# Patient Record
Sex: Female | Born: 1970 | Hispanic: No | Marital: Married | Smoking: Current every day smoker
Health system: Southern US, Academic
[De-identification: ages and names within clinical notes are randomized; demographics above are authoritative.]

## PROBLEM LIST (undated history)

## (undated) DIAGNOSIS — M129 Arthropathy, unspecified: Secondary | ICD-10-CM

## (undated) DIAGNOSIS — K760 Fatty (change of) liver, not elsewhere classified: Secondary | ICD-10-CM

## (undated) HISTORY — PX: HX HYSTERECTOMY: SHX81

## (undated) HISTORY — DX: Arthropathy, unspecified: M12.9

## (undated) HISTORY — PX: HX CARPAL TUNNEL RELEASE: SHX101

## (undated) HISTORY — DX: Fatty (change of) liver, not elsewhere classified: K76.0

---

## 2016-03-20 ENCOUNTER — Ambulatory Visit (HOSPITAL_COMMUNITY): Payer: Self-pay

## 2019-05-19 IMAGING — MR MRI ABDOMEN WITHOUT CONTRAST
10 series · 48 of 48 positions shown · IV contrast (gadolinium)
Comparison: None available.

EXAM:  MRI ABDOMEN WITHOUT CONTRAST
INDICATION: Abdominal pain and swelling.
TECHNIQUE: Multiplanar multisequential MRI of the abdomen was performed without gadolinium contrast.

[Series 12: s-map axial bh · axial · 14.1mm · 7.03mm/px · z∈[-275,+275]mm · 9 of 80 slices shown]
[im 1/80]
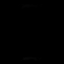
[im 10/80]
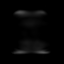
[im 20/80]
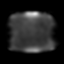
[im 30/80]
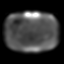
[im 40/80]
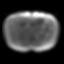
[im 50/80]
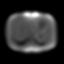
[im 60/80]
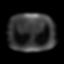
[im 70/80]
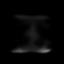
[im 80/80]
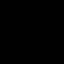

[Series 13: cor basg bh · coronal · 8.0mm · 1.25mm/px · 3 of 28 slices shown]
[im 1/28]
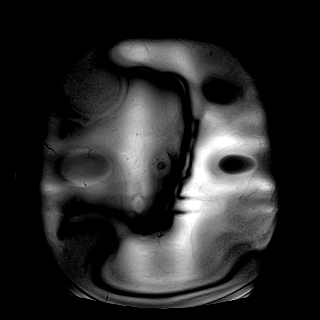
[im 14/28]
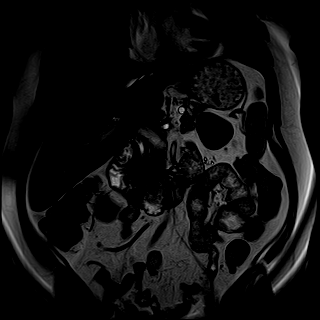
[im 28/28]
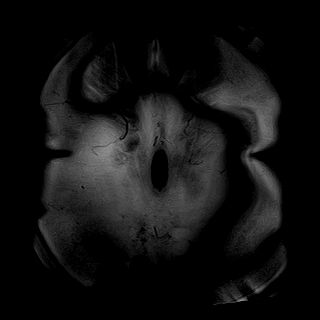

[Series 14: T2 · coronal · 8.0mm · 1.39mm/px · 3 of 28 slices shown (1 of 2)]
[im 1/28]
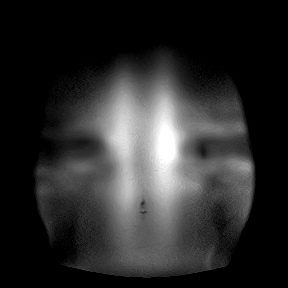
[im 14/28]
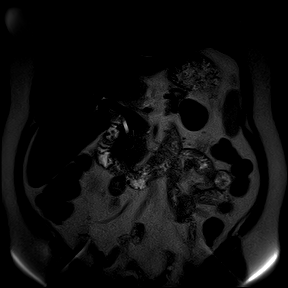
[im 28/28]
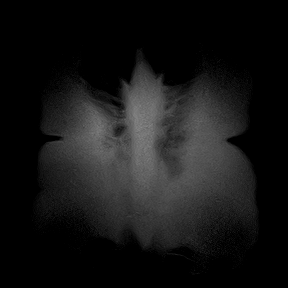

[Series 15: axial in/out phase · axial · 8.0mm · 1.56mm/px · z∈[-112,+185]mm · 4 of 34 slices shown (1 of 2)]
[im 1/34]
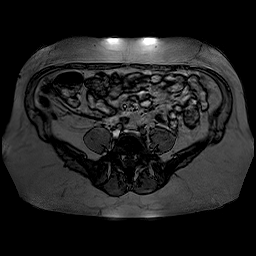
[im 12/34]
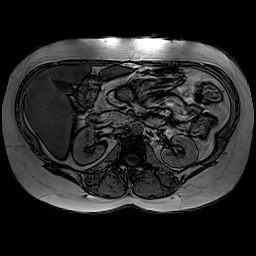
[im 23/34]
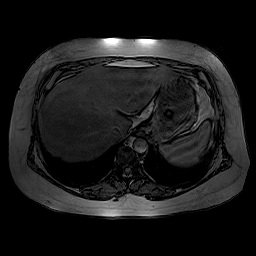
[im 34/34]
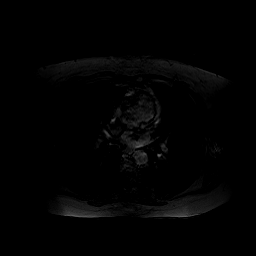

[Series 16: axial in/out phase · axial · 8.0mm · 1.56mm/px · z∈[-112,+185]mm · 4 of 34 slices shown (2 of 2)]
[im 1/34]
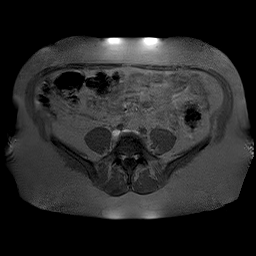
[im 12/34]
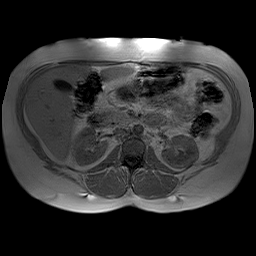
[im 23/34]
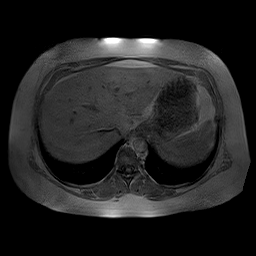
[im 34/34]
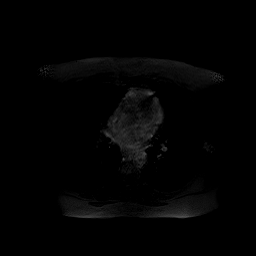

[Series 17: T2 fat-sat · axial · 8.0mm · 1.79mm/px · z∈[-112,+185]mm · 4 of 34 slices shown]
[im 1/34]
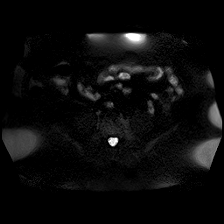
[im 12/34]
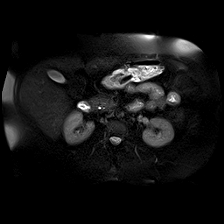
[im 23/34]
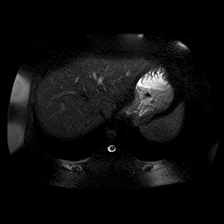
[im 34/34]
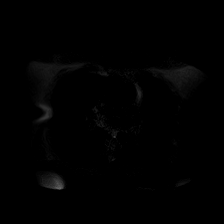

[Series 18: T2 · axial · 8.0mm · 1.56mm/px · z∈[-112,+185]mm · 4 of 34 slices shown (2 of 2)]
[im 1/34]
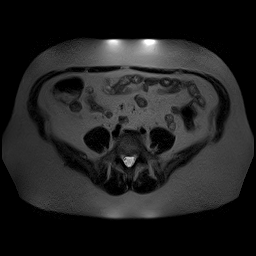
[im 12/34]
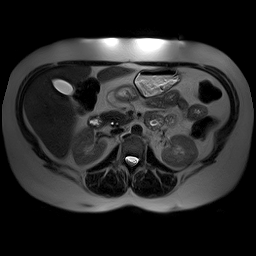
[im 23/34]
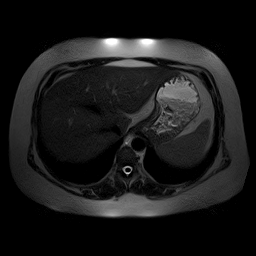
[im 34/34]
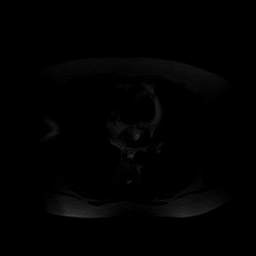

[Series 21: axial basg bh · axial · 8.0mm · 1.25mm/px · z∈[-112,+185]mm · 4 of 34 slices shown]
[im 1/34]
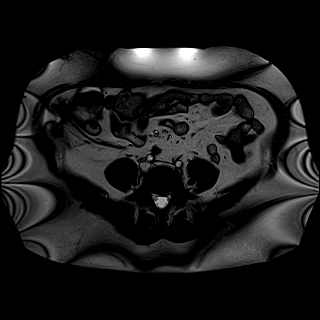
[im 12/34]
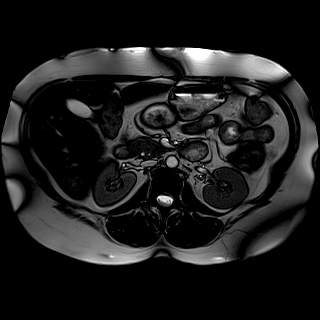
[im 23/34]
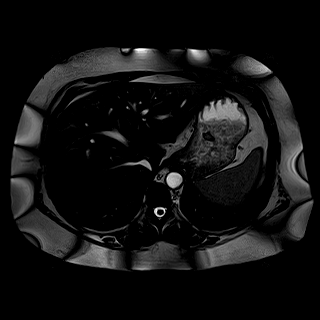
[im 34/34]
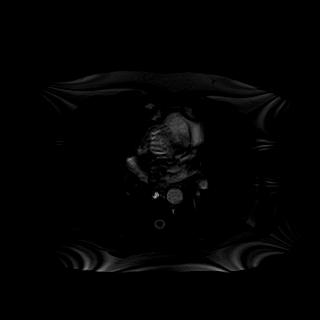

[Series 22: (person_name) · coronal · 6.0mm · 1.25mm/px · 8 of 72 slices shown]
[im 1/72]
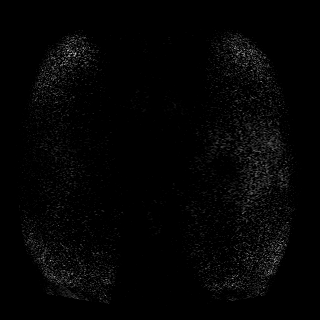
[im 11/72]
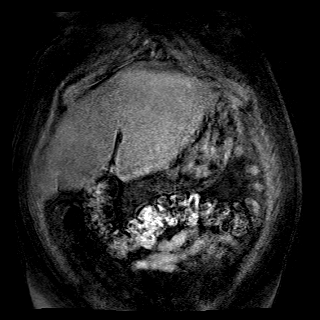
[im 21/72]
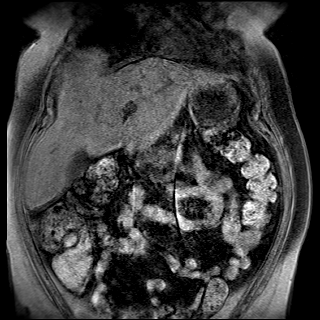
[im 31/72]
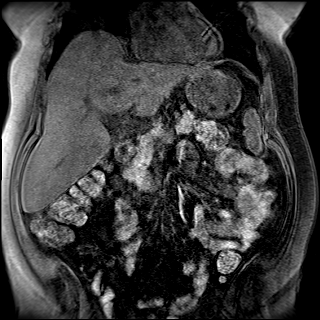
[im 41/72]
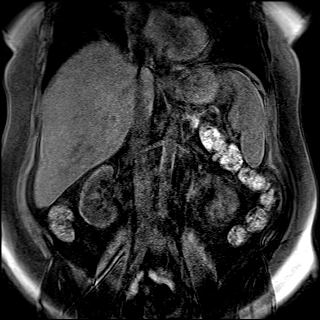
[im 51/72]
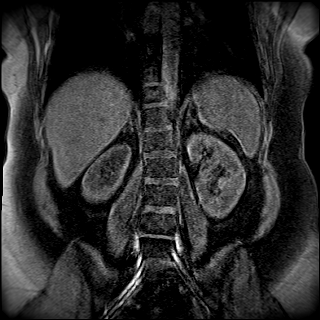
[im 61/72]
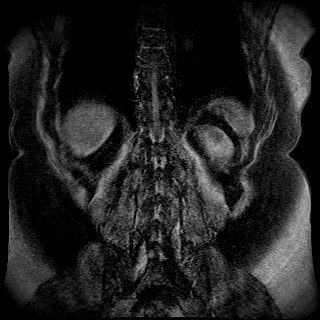
[im 72/72]
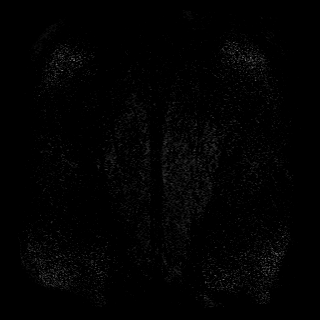

[Series 23: axial (person_name) · axial · 10.0mm · 1.25mm/px · z∈[-62,+163]mm · 5 of 46 slices shown]
[im 1/46]
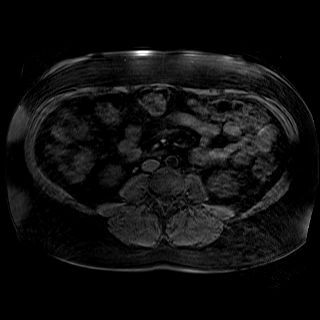
[im 12/46]
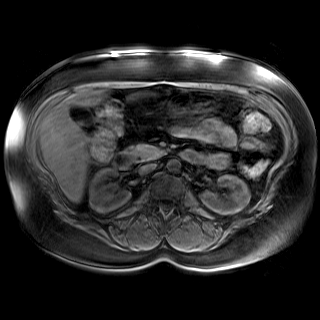
[im 23/46]
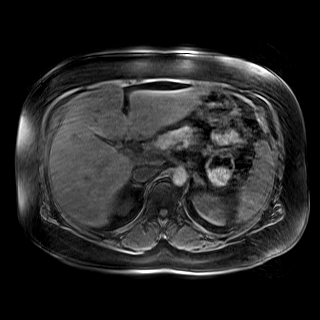
[im 34/46]
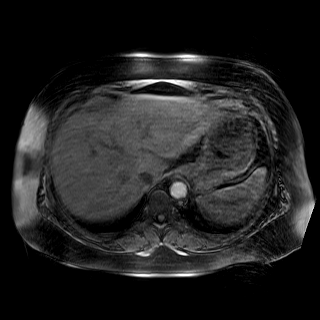
[im 46/46]
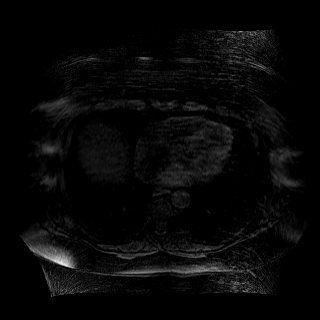

[48 of 48 positions shown; findings below may reference images not displayed]

FINDINGS: Liver is severely fatty and enlarged measuring 23.5 cm in maximum craniocaudal dimension. There is a single subcentimeter cyst within each kidney. Gallbladder, spleen, pancreas and adrenal glands are normal. No pleural effusion or upper abdominal ascites is seen. There is no definite retroperitoneal adenopathy.
IMPRESSION: Severely fatty and enlarged liver.

## 2020-03-16 ENCOUNTER — Telehealth (HOSPITAL_BASED_OUTPATIENT_CLINIC_OR_DEPARTMENT_OTHER): Payer: Self-pay

## 2020-03-16 NOTE — Nursing Note (Signed)
Reason for referral: Joint Pain; + ANA lab    Referred by office of: Hanley Seamen NP; [facility]; [place] [phone]    Records including: referral order. office note 02/23/20    Missing records: Rheum/Basic labs- Information fax request sent to referring provider for additional information.     Contacted patient to schedule NPV appointment. Scheduled with Dr. Lynnea Maizes on 04/18/2020 at 8:00 AM. Patient agreed. Patient given address of facility. Patient instructed to bring insurance card/med list to scheduled appointment. Faxing appointment letter to referring provider, importing referral to media. Luster Landsberg, MA  03/16/2020, 15:32

## 2020-03-17 NOTE — Nursing Note (Signed)
Received fax from referring office of labs 02/16/20 (CBC, Lipid, CMP, Uric, CRP, RHF, ANA NASH), XR Chest 02/16/20  Importing into media. Luster Landsberg, MA  03/17/2020, 13:16

## 2020-04-18 ENCOUNTER — Encounter (INDEPENDENT_AMBULATORY_CARE_PROVIDER_SITE_OTHER): Payer: Self-pay

## 2020-04-18 ENCOUNTER — Encounter (HOSPITAL_BASED_OUTPATIENT_CLINIC_OR_DEPARTMENT_OTHER): Payer: Self-pay | Admitting: Student in an Organized Health Care Education/Training Program

## 2020-04-18 ENCOUNTER — Other Ambulatory Visit: Payer: Self-pay

## 2020-04-18 ENCOUNTER — Ambulatory Visit
Payer: Medicaid Other | Attending: Rheumatology | Admitting: Student in an Organized Health Care Education/Training Program

## 2020-04-18 ENCOUNTER — Ambulatory Visit (INDEPENDENT_AMBULATORY_CARE_PROVIDER_SITE_OTHER): Payer: Medicaid Other

## 2020-04-18 ENCOUNTER — Ambulatory Visit (HOSPITAL_BASED_OUTPATIENT_CLINIC_OR_DEPARTMENT_OTHER): Payer: Medicaid Other

## 2020-04-18 VITALS — BP 118/72 | HR 96 | Temp 97.4°F | Ht 64.0 in | Wt 198.2 lb

## 2020-04-18 DIAGNOSIS — M160 Bilateral primary osteoarthritis of hip: Secondary | ICD-10-CM

## 2020-04-18 DIAGNOSIS — Z6834 Body mass index (BMI) 34.0-34.9, adult: Secondary | ICD-10-CM

## 2020-04-18 DIAGNOSIS — M1990 Unspecified osteoarthritis, unspecified site: Secondary | ICD-10-CM

## 2020-04-18 DIAGNOSIS — M7732 Calcaneal spur, left foot: Secondary | ICD-10-CM

## 2020-04-18 DIAGNOSIS — M79641 Pain in right hand: Secondary | ICD-10-CM

## 2020-04-18 DIAGNOSIS — R768 Other specified abnormal immunological findings in serum: Secondary | ICD-10-CM | POA: Insufficient documentation

## 2020-04-18 DIAGNOSIS — M1712 Unilateral primary osteoarthritis, left knee: Secondary | ICD-10-CM

## 2020-04-18 DIAGNOSIS — Z872 Personal history of diseases of the skin and subcutaneous tissue: Secondary | ICD-10-CM | POA: Insufficient documentation

## 2020-04-18 DIAGNOSIS — M79642 Pain in left hand: Secondary | ICD-10-CM

## 2020-04-18 DIAGNOSIS — M25561 Pain in right knee: Secondary | ICD-10-CM

## 2020-04-18 DIAGNOSIS — M7731 Calcaneal spur, right foot: Secondary | ICD-10-CM

## 2020-04-18 LAB — C-REACTIVE PROTEIN(CRP),INFLAMMATION: CRP INFLAMMATION: 3 mg/L (ref <8.0)

## 2020-04-18 LAB — SEDIMENTATION RATE: ERYTHROCYTE SEDIMENTATION RATE (ESR): 12 mm/hr (ref 0–20)

## 2020-04-18 MED ORDER — CELECOXIB 200 MG CAPSULE
200.0000 mg | ORAL_CAPSULE | Freq: Two times a day (BID) | ORAL | 1 refills | Status: DC
Start: 2020-04-18 — End: 2020-05-30

## 2020-04-18 NOTE — Progress Notes (Signed)
RHEUMATOLOGY, [facility]  [address] [address]    HPI:  This is a case of a 50 y.o. year old female who comes in today for evaluation of positive antinuclear antibody in setting of joint pain.    Medical conditions notable for:  - degenerative disc disease  - bony prominences of her DIPs  - subcutaneous nodules over her abdomen     Patient has pain in of her joints including her lumbar spine, hips, knees, feet, elbows and hands.  Hands in knees are typically the most bothersome. Knees will get swollen and painful. (no redness or warmth). She will have to get up at night and move sometimes to help alleviate the pain. She began getting new skin lesions over feet, palms, arms, and scalp (suspected psoriasis) within the past year.  It responded very well to topical steroid creams.  She has deep pain in her legs (feels like bone pain). She has noticed new knots in her legs, stomach, and arms. She had an ultrasound of the knots. There were concerns about possible leukemia. She also had tender points in the arms and mid calves. She has had sausage digits of the fingers a couple times a year for the last 8 years.  She has not done physical therapy for her IT bands. She had done PT in the     Morning stiffness- about an hour  Raynauds- none  Rashes- none  Nails- ridges over the last couple months  Oral ulcers- none  Red/painful eyes- none (she has eye spasms, colon spasms, and leg spasms)    She has episodes of shortness of breath that resolves spontaneously. She has episodes of abdominal pain and issues with digestion. She has intermittent chest pain. She has     All systems reviewed and are otherwise unremarkable unless stated above.    Social Hx- no alcohol use, 1/2 pack smoking, resides in [place]; Hx of hysterectomy   Family Hx- cousin- Chief Technology Officer, son- Crohn's disease, Aunt- SLE/RA    Current Outpatient Medications   Medication Sig Dispense Refill   . EPINEPHrine 0.3  mg/0.3 mL Injection Auto-Injector INJECT CONTENTS OF 1 PEN AS NEEDED FOR ALLERGIC REACTION     . ezetimibe (ZETIA) 10 mg Oral Tablet Take 10 mg by mouth Once a day     . KRILL OIL ORAL Take by mouth       No current facility-administered medications for this visit.     Allergies   Allergen Reactions   . Beeswax        PHYSICAL EXAM:  VITALS: BP 118/72   Pulse 96   Temp 36.3 C (97.4 F)   Ht 1.626 m ($Remove'5\' 4"'CHFfAfh$ )   Wt 89.9 kg (198 lb 3.1 oz)   SpO2 97%   BMI 34.02 kg/m   General- well appearing woman resting comfortably in chair  Neurologic- no focal deficits noted, speaks in full sentences and answers questions appropriately   HEENT- non traumatic skull, no scleral icterus, normal appearing oral mucosa, poor dentition   Lungs- normal work of breathing, CTA bilaterally, no wheezes, no rhonchi  Heart- regular rate, regular rhythm, normal S1 and S2, no murmur  Abdomen- soft, non tender, non distended, bowel sounds present  Skin- small area of skin dryness over right palm, normal appearing finger nails bilaterally   Musculoskeletal- no synovitis, mild tenderness over bilateral 4th PIP joints, mild tenderness over left 2nd MCP joint, 5/5 muscle strength in upper and  lower extremities  Psych- appropriate mood and corresponding affect     Bedside ultrasound- no active Doppler suggestive of underlying inflammation, mild cortical irregularities of the PIP and PIP joints noted    Labs/imaging:  01/2020 labs  WBC 8.9, hemoglobin 14.4, platelet count 305  Creatinine 0.7, Ca 9.4  Alk-phos 82, AST 13, ALT 23  Negative rheumatoid factor  ANA 1:160, smooth pattern  Uric acid 5.2  N1 NASH score (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 year old female presents today for evaluation of positive ANA in the setting of patient having arthralgia, subcutaneous nodules, and history of suspected psoriasis.  Patient has musculoskeletal thing that can be significantly bothersome in her lower and upper extremities as well as  tightening/tenderness around her IT band region bilaterally.  She does not have any active synovitis on today's examination.  Bedside ultrasound did not reveal any synovitis either.  I do not suspect her ANA to be pathologic at this time as she does not have other symptoms suggestive of lupus, mixed connective tissue disease, systemic sclerosis, or other connective tissue diseases commonly associated with a positive ANA.  She does not have rheumatoid arthritis however her history of intermittent swelling of the fingers could be dactylitis which is commonly associated with psoriatic arthritis.  Will trial patient on NSAIDs for 2-3 weeks daily and then she can use them as needed.  A prescription for Celebrex was provided.  Will obtain CRP/ESR which may be helpful if they are elevated, however frequently they are not elevated even in active psoriatic arthritis.  Will also obtain x-rays of the hands, knees, feet, and SI joints to investigate for osteoarthritis changes versus psoriatic arthritis changes.  Patient has had questionable dactylitis for the past 8 years occurring intermittently.  I would anticipate her to have changes seen on x-rays if this was predominantly inflammatory.  Will follow-up in 6 weeks via video visit given patient's commute is approximately 3-1/2 hours.  Patient was encouraged to take pictures of her hands or other joints that become swollen as well as if she has any new rashes.      1. Arthritis  - patient likely has osteoarthritis but may have a component of inflammatory arthritis that warrants further investigation at this time.  - Patient encouraged to seek physical therapy for IT band syndrome as I suspect she will have significant improvement in symptoms with 3-4 weeks of PT exercises;  - Sedimentation Rate; Future  - C-REACTIVE PROTEIN(CRP),INFLAMMATION; Future  - XR HAND AND WRIST BI, ARTHRITIS SERIES; Future  - XR KNEE STANDING BILATERAL 3 VIEWS EA; Future  - XR FEET BILATERAL 2 VIEW;  Future  - celecoxib (CELEBREX) 200 mg Oral Capsule; Take 1 Capsule (200 mg total) by mouth Twice daily  Dispense: 60 Capsule; Refill: 1  - XR SACROILIAC JOINTS BILATERAL; Future    2. Positive ANA (antinuclear antibody)  - not suspected to be pathologic at this time  - 5% of the healthy population will have an ANA of 1:160    3. Hx of psoriasis  - stable      Return in about 6 weeks (around 05/30/2020) for Video Visit on a Monday.    Jerelyn Charles, MD      04/18/2020  I saw and examined the patient.  I reviewed the fellow's note.  I agree with the findings and plan of care as documented in the fellow's note.  Any exceptions/additions are edited/noted.    // Roselyn Reef  Delano Metz, DO  Assistant Professor  Department of Medicine, Section of Rheumatology  [facility]

## 2020-04-18 NOTE — Patient Instructions (Signed)
-   Recommend starting Celebrex (a NSAID) one tablet twice daily.  - Please obtain xrays (orders in place).  - Plan to follow up via video visit in 6 weeks

## 2020-04-19 ENCOUNTER — Telehealth (HOSPITAL_BASED_OUTPATIENT_CLINIC_OR_DEPARTMENT_OTHER): Payer: Self-pay | Admitting: Student in an Organized Health Care Education/Training Program

## 2020-04-19 NOTE — Telephone Encounter (Signed)
Requested PA through epic.  Elektra Wartman, Ambulatory Care Assistant

## 2020-04-20 ENCOUNTER — Encounter (HOSPITAL_BASED_OUTPATIENT_CLINIC_OR_DEPARTMENT_OTHER): Payer: Self-pay | Admitting: Student in an Organized Health Care Education/Training Program

## 2020-05-30 ENCOUNTER — Ambulatory Visit
Payer: Medicaid Other | Attending: Rheumatology | Admitting: Student in an Organized Health Care Education/Training Program

## 2020-05-30 DIAGNOSIS — M1990 Unspecified osteoarthritis, unspecified site: Secondary | ICD-10-CM | POA: Insufficient documentation

## 2020-05-30 DIAGNOSIS — R768 Other specified abnormal immunological findings in serum: Secondary | ICD-10-CM | POA: Insufficient documentation

## 2020-05-30 MED ORDER — CELECOXIB 200 MG CAPSULE
200.0000 mg | ORAL_CAPSULE | Freq: Two times a day (BID) | ORAL | 1 refills | Status: DC
Start: 2020-05-30 — End: 2022-05-02

## 2020-05-30 NOTE — Progress Notes (Signed)
R[facility]UMATOLOGY, [facility]  [place] [address]  Operated by [facility]  Telephone Visit    Name:  Amber Martinez MRN: [id number]   Date:  05/30/2020 Age:   50 y.o.     The patient/family initiated a request for telephone service.  Verbal consent for this service was obtained from the patient/family.    Last office visit in this department: 04/18/2020      Reason for call: Follow up OA  Call notes:  Subcutaneous knots in legs have spread to her ribs and back.  She feels the knots are hardening. Thyroid feels enlarged now. Nausea and headaches occur frequently. Liver is enlarged. Joints are sore, but largely unchanged from last visit.     2 ICD-10-CM    1. Positive ANA (antinuclear antibody)  R76.8    2. Arthritis  M19.90 celecoxib (CELEBREX) 200 mg Oral Capsule     - arthralgia suspected to be from OA  - positive ANA not suspected to be pathologic  - resent Celebrex prescription to see if it will help with joint pain (patient will message her gastroenterologist to make sure it is alright to use)  - follow up as needed    Total provider time spent with the patient on the phone: 17 minutes.    Jerelyn Charles, MD      05/30/2020  I spoke with the patient over the phone.  I reviewed the fellow's note.  I agree with the findings and plan of care as documented in the fellow's note.  Any exceptions/additions are edited/noted.    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 The facets are intact. The bilateral sacroiliac joints demonstrate  mild degenerative changes. The overlying bowel gas pattern is nonobstructed.    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    04/18/20 XR Hands/Wrists:  FINDINGS:  The bilateral hands demonstrate normal anatomic alignment. There are no significant degenerative changes identified. Joint space is preserved. No evidence of fracture. The soft tissues are unremarkable.    IMPRESSION: No acute osseous abnormalities or degenerative joint disease    04/18/20 XR Knees:  FINDINGS:  The right knee is in normal anatomic alignment demonstrates no significant degenerative changes. The left knee demonstrates moderate degenerative changes most prominent within the medial compartment with subchondral sclerosis, osteophytosis and joint space loss. The soft tissues demonstrate mild edema.    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    04/18/20 XR Feet:  FINDINGS:  The bilateral feet are normal in anatomic alignment demonstrate no evidence of fracture. No bunion deformities are identified. Joint spaces are preserved. There are bilateral calcaneal bone spurs and tiny Achilles enthesophytes. No significant degenerative changes are identified.    IMPRESSION:  1.No significant degenerative changes of the bilateral feet.  2.Small calcaneal bone spurs.    // Caroleen Hamman. Lynne Leader, DO  Assistant Professor  Department of Medicine, Section of Rheumatology  [facility]

## 2020-06-10 ENCOUNTER — Telehealth (HOSPITAL_BASED_OUTPATIENT_CLINIC_OR_DEPARTMENT_OTHER): Payer: Self-pay | Admitting: Student in an Organized Health Care Education/Training Program

## 2020-06-10 NOTE — Nursing Note (Signed)
Faxed prior authorization request and chart notes for Celecoxib to the [employer] at [phone]. Fax confirmation received.   Arletha Pili, RN  06/10/2020, 13:20

## 2020-06-14 ENCOUNTER — Encounter (HOSPITAL_BASED_OUTPATIENT_CLINIC_OR_DEPARTMENT_OTHER): Payer: Self-pay

## 2020-06-14 NOTE — Nursing Note (Signed)
Prior authorization for Celecoxib is pending. [employer] has additional questions, will be placed in folder for completion.   Also sent patient MyChart message in regards to this.  Arletha Pili, RN  06/14/2020, 12:19

## 2020-07-18 NOTE — Nursing Note (Signed)
Received denial for Celecoxib via [employer]. Patient does not meet [facility] Medicaid Criteria approval. Patient must attempt all preferred NSAID's. Will placed denial in folder for review.   Arletha Pili, RN  07/18/2020, 12:12

## 2020-09-05 ENCOUNTER — Telehealth (HOSPITAL_BASED_OUTPATIENT_CLINIC_OR_DEPARTMENT_OTHER): Payer: Self-pay | Admitting: Student in an Organized Health Care Education/Training Program

## 2020-09-05 NOTE — Nursing Note (Signed)
PA sent in Epic  [place], Ambulatory Care Assistant  09/05/2020, 07:46

## 2022-04-24 ENCOUNTER — Other Ambulatory Visit (INDEPENDENT_AMBULATORY_CARE_PROVIDER_SITE_OTHER): Payer: Self-pay | Admitting: Student in an Organized Health Care Education/Training Program

## 2022-04-24 DIAGNOSIS — M25561 Pain in right knee: Secondary | ICD-10-CM

## 2022-05-02 ENCOUNTER — Other Ambulatory Visit: Payer: Self-pay

## 2022-05-02 ENCOUNTER — Ambulatory Visit
Payer: 59 | Attending: Student in an Organized Health Care Education/Training Program | Admitting: Student in an Organized Health Care Education/Training Program

## 2022-05-02 ENCOUNTER — Inpatient Hospital Stay (INDEPENDENT_AMBULATORY_CARE_PROVIDER_SITE_OTHER)
Admission: RE | Admit: 2022-05-02 | Discharge: 2022-05-02 | Disposition: A | Discharge status: Home / Self Care | Payer: 59 | Source: Ambulatory Visit | Attending: Student in an Organized Health Care Education/Training Program | Admitting: Student in an Organized Health Care Education/Training Program

## 2022-05-02 ENCOUNTER — Encounter (INDEPENDENT_AMBULATORY_CARE_PROVIDER_SITE_OTHER): Payer: Self-pay | Admitting: Student in an Organized Health Care Education/Training Program

## 2022-05-02 ENCOUNTER — Other Ambulatory Visit (INDEPENDENT_AMBULATORY_CARE_PROVIDER_SITE_OTHER): Payer: Self-pay | Admitting: Student in an Organized Health Care Education/Training Program

## 2022-05-02 DIAGNOSIS — M1712 Unilateral primary osteoarthritis, left knee: Secondary | ICD-10-CM | POA: Insufficient documentation

## 2022-05-02 DIAGNOSIS — M25561 Pain in right knee: Secondary | ICD-10-CM

## 2022-05-02 MED ORDER — LIDOCAINE (PF) 10 MG/ML (1 %) INJECTION SOLUTION
4.0000 mL | Freq: Once | INTRAMUSCULAR | Status: AC
Start: 2022-05-02 — End: 2022-05-02
  Administered 2022-05-02: 4 mL via INTRAMUSCULAR

## 2022-05-02 MED ORDER — TRIAMCINOLONE ACETONIDE 40 MG/ML SUSPENSION FOR INJECTION
80.0000 mg | INTRAMUSCULAR | Status: AC
Start: 2022-05-02 — End: 2022-05-02
  Administered 2022-05-02: 80 mg via INTRA_ARTICULAR

## 2022-05-02 NOTE — Progress Notes (Signed)
[facility]  [address]  [place] [address]       Name: Amber Martinez MRN:  [id number]   Date: 05/02/2022 Age: 52 y.o.        CHIEF COMPLAINT: New Patient (Lt knee pain  swelling limited ROM )        HISTORY OF PRESENT ILLNESS:    The patient is a 52 y.o. female who presents with left knee pain that has been present for the past several months.  She states that it is progressively worsening.  She has had significant pain with ambulation and has had difficulty walking due to the pain.  She states that prior to the past few months she did have some intermittent achiness to the knee but she was able to tolerate it.  She has an ashen in his unable to take anti-inflammatories due to this.  She takes Tylenol as needed for the pain..        Past Medical History:    No past medical history on file.   Past Surgical History:    No past surgical history on file.   Current Medications:   Current Outpatient Medications   Medication Sig    ezetimibe (ZETIA) 10 mg Oral Tablet Take 1 Tablet (10 mg total) by mouth Once a day      Allergies  Allergies   Allergen Reactions    Beeswax       Social history  Social History     Socioeconomic History    Marital status: Married     Spouse name: Not on file    Number of children: Not on file    Years of education: Not on file    Highest education level: Not on file   Occupational History    Not on file   Tobacco Use    Smoking status: Every Day    Smokeless tobacco: Never   Substance and Sexual Activity    Alcohol use: Not on file    Drug use: Not on file    Sexual activity: Not on file   Other Topics Concern    Not on file   Social History Narrative    Not on file     Social Determinants of Health     Financial Resource Strain: Not on file   Transportation Needs: Not on file   Social Connections: Not on file   Intimate Partner Violence: Not on file   Housing Stability: Not on file      Family History:   Family Medical History:    None              REVIEW OF SYSTEMS as discussed in HPI     PHYSICAL EXAM:    VITALS:  There were no vitals taken for this visit.      Constitutional: Oriented to person, place, and time; Answer questions appropriately  HENT: Head: Normocephalic and atraumatic.   Eyes: EOMI, PEARL  Neck: Supple, trachea midline  Cardiovascular: Brisk capillary refill to all extremities, extremities well perfused  Pulmonary/Chest: No increased work of breathing, no cough  Abdominal: Non-tender, non-distended  Neurologic:  Awake, alert and oriented in three planes. No gross deficits   Musculoskeletal:    Left lower extremity  Skin is intact circumferentially she has no pain with range of motion of the hips.  She is tender to palpation over the medial joint line.  Stable to varus valgus stress testing.  Compartments are soft and  compressible negative anterior-posterior drawer.  Negative patellofemoral grind.  Sensation is grossly intact.  Small effusion on today's exam.  No erythema or warmth around the knee.  Good range motion to the knee full extension flexion to 110         DATA:    CBC: No results found for: "WBC", "RBC", "HGB", "HCT", "MCV", "MCH", "MCHC", "RDW", "PLT", "MPV"  Radiology Review:   X-ray of the left knee was obtained and reviewed in office today demonstrating tricompartmental osteoarthritis most significantly at the medial joint line.  There is medial joint space narrowing with a osteophyte present to the medial tibial plateau and femoral condyle.  There is a small osteophyte present to the patella as well.      Impression:  Tricompartmental osteoarthritis     IMPRESSION:    ICD-10-CM    1. Primary osteoarthritis of left knee  M17.12 triamcinolone acetonide (KENALOG-40) 40 mg/mL injection     lidocaine PF (XYLOCAINE-MPF) 1% injection              PLAN:  1. I discussed with her diagnosis she does have tricompartmental osteoarthritis.  She has not tried a steroid injection.  She is unable to take anti-inflammatories.  She can  continue with the Tylenol as needed for the pain.  I did recommend a steroid injection into the knee today.  I discussed that if the swelling returns in it is significant that she should contact my office and we will get her in and we can aspirate the fluid and send it for studies.  She does not appear to have any signs of infection on today's exam.  She is in agreement she wished to proceed with the injection.  She will follow up with me in 3 months.    Orders Placed This Encounter    triamcinolone acetonide (KENALOG-40) 40 mg/mL injection    lidocaine PF (XYLOCAINE-MPF) 1% injection      Return in about 3 months (around 08/01/2022).     [name], DO  05/02/2022 08:17

## 2022-05-02 NOTE — Procedures (Signed)
[facility]ICS[facility]  [place] [address]    Procedure Note    Name: Amber Martinez MRN:  [id number]   Date: 05/02/2022 DOB:  11-11-1970 (52 y.o.)         Large Joint Arthrocentesis: L knee  Indications: pain and joint swelling  Details: 21 G needle, anterolateral approach  Outcome: tolerated well, no immediate complications  Procedure, treatment alternatives, risks and benefits explained, specific risks discussed. Consent was given by the patient. Patient was prepped and draped in the usual sterile fashion.         Procedure Note Knee Cortisone Injection  The Leftknee was identified as the injection site. The risk and benefits of a cortisone injection were explained and the patient consented to the injection. Under sterile conditions, the knee was injected with a mixture of 80 mg of Kenalog and and 4 mL of 1% lidocaine without complication. A sterile bandage was applied.    [name], DO

## 2022-06-20 ENCOUNTER — Ambulatory Visit (INDEPENDENT_AMBULATORY_CARE_PROVIDER_SITE_OTHER): Payer: Self-pay | Admitting: NURSE PRACTITIONER

## 2022-06-26 ENCOUNTER — Other Ambulatory Visit (HOSPITAL_COMMUNITY): Payer: Self-pay

## 2022-06-26 DIAGNOSIS — S134XXD Sprain of ligaments of cervical spine, subsequent encounter: Secondary | ICD-10-CM

## 2022-06-26 DIAGNOSIS — M5416 Radiculopathy, lumbar region: Secondary | ICD-10-CM

## 2022-07-06 ENCOUNTER — Inpatient Hospital Stay (HOSPITAL_COMMUNITY)
Admission: RE | Admit: 2022-07-06 | Discharge: 2022-07-06 | Discharge status: Home / Self Care | Payer: 59 | Source: Ambulatory Visit

## 2022-07-06 ENCOUNTER — Other Ambulatory Visit: Payer: Self-pay

## 2022-07-06 ENCOUNTER — Inpatient Hospital Stay
Admission: RE | Admit: 2022-07-06 | Discharge: 2022-07-06 | Disposition: A | Discharge status: Home / Self Care | Payer: 59 | Source: Ambulatory Visit

## 2022-07-06 DIAGNOSIS — S134XXD Sprain of ligaments of cervical spine, subsequent encounter: Secondary | ICD-10-CM

## 2022-07-06 MED ORDER — GADOTERIDOL 279.3 MG/ML INTRAVENOUS SOLUTION
15.0000 mL | INTRAVENOUS | Status: AC
Start: 2022-07-06 — End: 2022-07-06
  Administered 2022-07-06: 15 mL via INTRAVENOUS
  Filled 2022-07-06: qty 15

## 2022-07-09 ENCOUNTER — Inpatient Hospital Stay
Admission: RE | Admit: 2022-07-09 | Discharge: 2022-07-09 | Disposition: A | Discharge status: Home / Self Care | Payer: 59 | Source: Ambulatory Visit

## 2022-07-09 ENCOUNTER — Inpatient Hospital Stay (HOSPITAL_COMMUNITY)
Admission: RE | Admit: 2022-07-09 | Discharge: 2022-07-09 | Discharge status: Home / Self Care | Payer: 59 | Source: Ambulatory Visit

## 2022-07-09 ENCOUNTER — Other Ambulatory Visit: Payer: Self-pay

## 2022-07-09 DIAGNOSIS — M5416 Radiculopathy, lumbar region: Secondary | ICD-10-CM

## 2022-07-09 DIAGNOSIS — S134XXD Sprain of ligaments of cervical spine, subsequent encounter: Secondary | ICD-10-CM | POA: Insufficient documentation

## 2022-07-11 ENCOUNTER — Ambulatory Visit (HOSPITAL_COMMUNITY): Payer: Self-pay

## 2022-08-06 ENCOUNTER — Ambulatory Visit (HOSPITAL_COMMUNITY): Payer: Self-pay | Admitting: Student in an Organized Health Care Education/Training Program

## 2022-09-21 ENCOUNTER — Other Ambulatory Visit (HOSPITAL_COMMUNITY): Payer: Self-pay | Admitting: NURSE PRACTITIONER

## 2022-09-21 DIAGNOSIS — F0781 Postconcussional syndrome: Secondary | ICD-10-CM

## 2022-10-05 ENCOUNTER — Ambulatory Visit (HOSPITAL_COMMUNITY): Payer: Self-pay

## 2022-10-22 ENCOUNTER — Other Ambulatory Visit: Payer: Self-pay

## 2022-10-22 ENCOUNTER — Emergency Department
Admission: EM | Admit: 2022-10-22 | Discharge: 2022-10-22 | Disposition: A | Discharge status: Home / Self Care | Payer: 59 | Attending: Emergency Medicine | Admitting: Emergency Medicine

## 2022-10-22 ENCOUNTER — Encounter (HOSPITAL_COMMUNITY): Payer: Self-pay

## 2022-10-22 ENCOUNTER — Emergency Department (HOSPITAL_COMMUNITY): Payer: 59

## 2022-10-22 DIAGNOSIS — R519 Headache, unspecified: Secondary | ICD-10-CM

## 2022-10-22 DIAGNOSIS — M62838 Other muscle spasm: Secondary | ICD-10-CM | POA: Insufficient documentation

## 2022-10-22 DIAGNOSIS — G44309 Post-traumatic headache, unspecified, not intractable: Secondary | ICD-10-CM | POA: Insufficient documentation

## 2022-10-22 DIAGNOSIS — F0781 Postconcussional syndrome: Secondary | ICD-10-CM | POA: Insufficient documentation
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
## 2022-10-22 NOTE — Discharge Instructions (Signed)
CT scan of the brain and bony skeleton are normal.  You will be referred to Neurology for your postconcussive syndrome.
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
## 2022-10-22 NOTE — ED Nurses Note (Signed)
Patient refuses wheelchair and ambulates out of ED VSS: MAE: AOx4 with all personal belongings and printed AVS.
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
## 2023-02-28 ENCOUNTER — Ambulatory Visit (HOSPITAL_BASED_OUTPATIENT_CLINIC_OR_DEPARTMENT_OTHER): Payer: Self-pay | Admitting: NEUROLOGY

## 2023-04-29 ENCOUNTER — Encounter (HOSPITAL_BASED_OUTPATIENT_CLINIC_OR_DEPARTMENT_OTHER): Payer: Self-pay

## 2023-04-29 ENCOUNTER — Ambulatory Visit (HOSPITAL_BASED_OUTPATIENT_CLINIC_OR_DEPARTMENT_OTHER): Payer: Self-pay | Admitting: PAIN MANAGEMENT

## 2023-06-18 ENCOUNTER — Encounter (INDEPENDENT_AMBULATORY_CARE_PROVIDER_SITE_OTHER): Payer: Self-pay

## 2023-10-02 ENCOUNTER — Ambulatory Visit (HOSPITAL_BASED_OUTPATIENT_CLINIC_OR_DEPARTMENT_OTHER): Payer: Self-pay | Admitting: NEUROLOGY
